# Patient Record
Sex: Male | Born: 1990 | Race: White | Hispanic: No | Marital: Married | Smoking: Never smoker
Health system: Southern US, Community
[De-identification: ages and names within clinical notes are randomized; demographics above are authoritative.]

---
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
## 2013-08-16 ENCOUNTER — Ambulatory Visit: Payer: Self-pay | Admitting: Internal Medicine

## 2013-08-22 ENCOUNTER — Ambulatory Visit (INDEPENDENT_AMBULATORY_CARE_PROVIDER_SITE_OTHER): Payer: PRIVATE HEALTH INSURANCE | Admitting: Internal Medicine

## 2013-08-22 ENCOUNTER — Encounter: Payer: Self-pay | Admitting: Internal Medicine

## 2013-08-22 VITALS — BP 106/64 | HR 72 | Temp 98.0°F | Ht 66.0 in | Wt 156.0 lb

## 2013-08-22 DIAGNOSIS — K625 Hemorrhage of anus and rectum: Secondary | ICD-10-CM

## 2013-08-22 DIAGNOSIS — Z23 Encounter for immunization: Secondary | ICD-10-CM

## 2013-08-22 DIAGNOSIS — K648 Other hemorrhoids: Secondary | ICD-10-CM

## 2013-08-22 LAB — CBC WITH DIFFERENTIAL/PLATELET
BASOS ABS: 0 10*3/uL (ref 0.0–0.1)
BASOS PCT: 0 % (ref 0–1)
EOS PCT: 2 % (ref 0–5)
Eosinophils Absolute: 0.1 10*3/uL (ref 0.0–0.7)
HCT: 47.1 % (ref 39.0–52.0)
HEMOGLOBIN: 16.9 g/dL (ref 13.0–17.0)
Lymphocytes Relative: 25 % (ref 12–46)
Lymphs Abs: 1.8 10*3/uL (ref 0.7–4.0)
MCH: 32 pg (ref 26.0–34.0)
MCHC: 35.9 g/dL (ref 30.0–36.0)
MCV: 89.2 fL (ref 78.0–100.0)
MONO ABS: 0.7 10*3/uL (ref 0.1–1.0)
MONOS PCT: 10 % (ref 3–12)
NEUTROS ABS: 4.4 10*3/uL (ref 1.7–7.7)
Neutrophils Relative %: 63 % (ref 43–77)
PLATELETS: 217 10*3/uL (ref 150–400)
RBC: 5.28 MIL/uL (ref 4.22–5.81)
RDW: 13.3 % (ref 11.5–15.5)
WBC: 7 10*3/uL (ref 4.0–10.5)

## 2013-08-22 LAB — SEDIMENTATION RATE: SED RATE: 1 mm/h (ref 0–16)

## 2013-08-22 MED ORDER — HYDROCORTISONE ACE-PRAMOXINE 1-1 % RE FOAM
1.0000 | Freq: Four times a day (QID) | RECTAL | Status: AC
Start: 2013-08-22 — End: ?

## 2013-08-22 MED ORDER — TETANUS-DIPHTH-ACELL PERTUSSIS 5-2.5-18.5 LF-MCG/0.5 IM SUSP: 0.5000 mL | Freq: Once | INTRAMUSCULAR | Status: AC

## 2013-08-22 NOTE — Patient Instructions (Signed)
Tetanus immunization update given. Use Proctofoam HC qid x 2 weeks. Call if symptoms persist.

## 2014-02-18 ENCOUNTER — Encounter: Payer: Self-pay | Admitting: Internal Medicine

## 2014-02-18 NOTE — Progress Notes (Signed)
   Subjective:    Patient ID: Mario Clark, male    DOB: 11/26/1990, 23 y.o.   MRN: [id number]  HPI  First visit for this 23 year old White male who has had 3 or 4 episodes of rectal bleeding in the past 8-12 days. May have been a bit constipated. No history of serious illnesses.  No known drug allergies  No chronic medications  History of 2 arm fractures, one hand fracture into finger fractures.  Social history: He does not smoke. Social alcohol consumption consisting of beer. He has a high school education and works as a Academic librarianpipe fitter. His mother works for [employer]. He is single.  Family history: Patient's father died of mouth malignancy when patient was 23 years old. Mother is in good health. One sister age 22 in good health. No children.    Review of Systems     Objective:   Physical Exam Rectal exam reveals no masses. No stool to guaiac. Anoscopy shows no active bleeding. Internal hemorrhoids.       Assessment & Plan:  Rectal bleeding secondary to constipation  Plan: Proctofoam HC to use in rectal area 4 times a day for 2 weeks. Consider stool softener if symptoms persist. Needs tetanus immunization update. CBC and sed rate drawn. Tetanus immunization update given.  Addendum: CBC and sedimentation rate are normal.

## 2018-09-29 ENCOUNTER — Encounter (HOSPITAL_COMMUNITY): Payer: Self-pay | Admitting: Emergency Medicine

## 2018-09-29 ENCOUNTER — Ambulatory Visit (HOSPITAL_COMMUNITY)
Admission: EM | Admit: 2018-09-29 | Discharge: 2018-09-29 | Disposition: A | Discharge status: Home / Self Care | Payer: Managed Care, Other (non HMO) | Attending: Family Medicine | Admitting: Family Medicine

## 2018-09-29 DIAGNOSIS — R1084 Generalized abdominal pain: Secondary | ICD-10-CM | POA: Diagnosis not present

## 2018-09-29 DIAGNOSIS — R110 Nausea: Secondary | ICD-10-CM

## 2018-09-29 LAB — GLUCOSE, CAPILLARY: Glucose-Capillary: 76 mg/dL (ref 70–99)

## 2018-09-29 MED ORDER — DICYCLOMINE HCL 20 MG PO TABS: 20.0000 mg | ORAL_TABLET | Freq: Two times a day (BID) | ORAL | 0 refills | Status: AC

## 2018-09-29 MED ORDER — ONDANSETRON 4 MG PO TBDP: 4.0000 mg | ORAL_TABLET | Freq: Three times a day (TID) | ORAL | 0 refills | Status: AC | PRN

## 2018-09-29 MED ORDER — INSULIN ASPART 100 UNIT/ML ~~LOC~~ SOLN
SUBCUTANEOUS | Status: AC
  Filled 2018-09-29: qty 1

## 2018-09-29 MED ORDER — FAMOTIDINE 20 MG PO TABS: 20.0000 mg | ORAL_TABLET | Freq: Every day | ORAL | 0 refills | Status: AC

## 2018-09-29 MED ORDER — ONDANSETRON 4 MG PO TBDP
ORAL_TABLET | ORAL | Status: AC
  Filled 2018-09-29: qty 1

## 2018-09-29 NOTE — Discharge Instructions (Signed)
Your glucose was 76, lower suspicion for diabetes. This should also not be causing your current symptoms. Start pepcid and bently as directed. If still having nausea, you can take zofran to help with symptoms. Keep hydrated, urine should be clear to pale yellow in color. Avoid fatty/spicy foods for now. Follow up with PCP if symptoms still persists after 1 week. If experiencing worsening symptoms, worsening abdominal pain, unwilling to walk/jump due to pain, nausea/vomiting not controlled by medicine, fever, go to the emergency department of further evaluation needed.

## 2018-09-29 NOTE — ED Triage Notes (Signed)
Pt here for nausea and abd pain x 5 days; pt sts pain in intermittent in nature

## 2018-09-29 NOTE — ED Provider Notes (Signed)
[facility]    CSN: [id number] Arrival date & time: 09/29/18  1003     History   Chief Complaint Chief Complaint  Patient presents with  . Nausea  . Abdominal Pain    HPI Mario Clark is a 28 y.o. male.   28 year old male comes in for nausea, abdominal pain. States 4-5 days ago, had 2 episodes of diarrhea with stomach cramping and nausea. This resolved after the end of the day. States for the next 2-3 days, felt better without any symptoms. Last night, he started having nausea, generalized abdominal pain that is extending to today. He has been eating normally since the last episode 4-5 days ago, usually with burgers and take out food. He has not had problems with diary products in the past. Denies eating spicy food. He had potatoes and pork tenderloin last night prior to symptom onset without problems. Denies fever, chills, night sweats. Denies URI symptoms such as cough, congestion, sore throat. Former some day smoker. Current tobacco chewer. Denies drug use. 12 beers/week, denies recent alcohol use. No recent travels. Denies dizziness, weakness, syncope. Family history of DM. Possibly family history of IBS. Denies family history of crohns, UC.      History reviewed. No pertinent past medical history.  There are no active problems to display for this patient.   History reviewed. No pertinent surgical history.     Home Medications    Prior to Admission medications   Medication Sig Start Date End Date Taking? Authorizing Provider  dicyclomine (BENTYL) 20 MG tablet Take 1 tablet (20 mg total) by mouth 2 (two) times daily. 09/29/18   Cathie Hoops, Keavon Sensing V, PA-C  famotidine (PEPCID) 20 MG tablet Take 1 tablet (20 mg total) by mouth daily. 09/29/18   Cathie Hoops, Camron Essman V, PA-C  hydrocortisone-pramoxine (PROCTOFOAM HC) rectal foam Place 1 applicator rectally 4 (four) times daily. 08/22/13   Margaree Mackintosh, MD  ondansetron (ZOFRAN ODT) 4 MG disintegrating tablet Take 1 tablet (4 mg total) by  mouth every 8 (eight) hours as needed for nausea or vomiting. 09/29/18   Belinda Fisher, PA-C    Family History Family History  Problem Relation Age of Onset  . Cancer Father     Social History Social History   Tobacco Use  . Smoking status: Never Smoker  Substance Use Topics  . Alcohol use: Not on file  . Drug use: Not on file     Allergies   Patient has no known allergies.   Review of Systems Review of Systems  Reason unable to perform ROS: See HPI as above.     Physical Exam Triage Vital Signs ED Triage Vitals [09/29/18 1035]  Enc Vitals Group     BP (!) 130/96     Pulse Rate 80     Resp 18     Temp 98.5 F (36.9 C)     Temp Source Oral     SpO2 99 %     Weight      Height      Head Circumference      Peak Flow      Pain Score 5     Pain Loc      Pain Edu?      Excl. in GC?    No data found.  Updated Vital Signs BP (!) 130/96 (BP Location: Right Arm)   Pulse 80   Temp 98.5 F (36.9 C) (Oral)   Resp 18   SpO2 99%  Physical Exam Constitutional:      General: He is not in acute distress.    Appearance: He is well-developed. He is not ill-appearing, toxic-appearing or diaphoretic.  HENT:     Head: Normocephalic and atraumatic.     Mouth/Throat:     Mouth: Mucous membranes are moist.     Pharynx: Oropharynx is clear. Uvula midline.  Cardiovascular:     Rate and Rhythm: Normal rate and regular rhythm.     Heart sounds: Normal heart sounds. No murmur. No friction rub. No gallop.   Pulmonary:     Effort: Pulmonary effort is normal.     Breath sounds: Normal breath sounds. No wheezing or rales.  Abdominal:     General: Abdomen is flat. Bowel sounds are normal.     Palpations: Abdomen is soft. There is no mass.     Tenderness: There is no abdominal tenderness. There is no right CVA tenderness, left CVA tenderness, guarding or rebound.  Skin:    General: Skin is warm and dry.  Neurological:     Mental Status: He is alert and oriented to person,  place, and time.  Psychiatric:        Behavior: Behavior normal.        Judgment: Judgment normal.      UC Treatments / Results  Labs (all labs ordered are listed, but only abnormal results are displayed) Labs Reviewed  GLUCOSE, CAPILLARY  CBG MONITORING, ED    EKG None  Radiology No results found.  Procedures Procedures (including critical care time)  Medications Ordered in UC Medications - No data to display  Initial Impression / Assessment and Plan / UC Course  I have reviewed the triage vital signs and the nursing notes.  Pertinent labs & imaging results that were available during my care of the patient were reviewed by me and considered in my medical decision making (see chart for details).     CBG within normal limits.  Patient's symptoms intermittent, discussed possible GERD causing symptoms as well.  Zofran as needed.  Pepcid as directed.  Bentyl as directed.  Push fluids.  Return precautions given.  Patient expresses understanding and agrees to plan.  Final Clinical Impressions(s) / UC Diagnoses   Final diagnoses:  Nausea without vomiting  Generalized abdominal pain    ED Prescriptions    Medication Sig Dispense Auth. Provider   ondansetron (ZOFRAN ODT) 4 MG disintegrating tablet Take 1 tablet (4 mg total) by mouth every 8 (eight) hours as needed for nausea or vomiting. 15 tablet Tarren Velardi V, PA-C   famotidine (PEPCID) 20 MG tablet Take 1 tablet (20 mg total) by mouth daily. 15 tablet Afnan Cadiente V, PA-C   dicyclomine (BENTYL) 20 MG tablet Take 1 tablet (20 mg total) by mouth 2 (two) times daily. 20 tablet Threasa Alpha, [place] 09/29/18 1317

## 2022-08-24 ENCOUNTER — Emergency Department (HOSPITAL_BASED_OUTPATIENT_CLINIC_OR_DEPARTMENT_OTHER): Payer: Managed Care, Other (non HMO)

## 2022-08-24 ENCOUNTER — Emergency Department (HOSPITAL_BASED_OUTPATIENT_CLINIC_OR_DEPARTMENT_OTHER)
Admission: EM | Admit: 2022-08-24 | Discharge: 2022-08-24 | Disposition: A | Discharge status: Home / Self Care | Payer: Managed Care, Other (non HMO) | Attending: Emergency Medicine | Admitting: Emergency Medicine

## 2022-08-24 ENCOUNTER — Encounter (HOSPITAL_BASED_OUTPATIENT_CLINIC_OR_DEPARTMENT_OTHER): Payer: Self-pay

## 2022-08-24 ENCOUNTER — Other Ambulatory Visit: Payer: Self-pay

## 2022-08-24 DIAGNOSIS — Z1152 Encounter for screening for COVID-19: Secondary | ICD-10-CM | POA: Insufficient documentation

## 2022-08-24 DIAGNOSIS — G43909 Migraine, unspecified, not intractable, without status migrainosus: Secondary | ICD-10-CM

## 2022-08-24 DIAGNOSIS — R519 Headache, unspecified: Secondary | ICD-10-CM | POA: Diagnosis present

## 2022-08-24 LAB — CBC WITH DIFFERENTIAL/PLATELET
Abs Immature Granulocytes: 0.02 10*3/uL (ref 0.00–0.07)
Basophils Absolute: 0 10*3/uL (ref 0.0–0.1)
Basophils Relative: 0 %
Eosinophils Absolute: 0 10*3/uL (ref 0.0–0.5)
Eosinophils Relative: 0 %
HCT: 42.8 % (ref 39.0–52.0)
Hemoglobin: 16 g/dL (ref 13.0–17.0)
Immature Granulocytes: 0 %
Lymphocytes Relative: 12 %
Lymphs Abs: 1 10*3/uL (ref 0.7–4.0)
MCH: 32 pg (ref 26.0–34.0)
MCHC: 37.4 g/dL — ABNORMAL HIGH (ref 30.0–36.0)
MCV: 85.6 fL (ref 80.0–100.0)
Monocytes Absolute: 0.7 10*3/uL (ref 0.1–1.0)
Monocytes Relative: 8 %
Neutro Abs: 6.9 10*3/uL (ref 1.7–7.7)
Neutrophils Relative %: 80 %
Platelets: 298 10*3/uL (ref 150–400)
RBC: 5 MIL/uL (ref 4.22–5.81)
RDW: 12.4 % (ref 11.5–15.5)
WBC: 8.7 10*3/uL (ref 4.0–10.5)
nRBC: 0 % (ref 0.0–0.2)

## 2022-08-24 LAB — BASIC METABOLIC PANEL
Anion gap: 10 (ref 5–15)
BUN: 7 mg/dL (ref 6–20)
CO2: 27 mmol/L (ref 22–32)
Calcium: 9.2 mg/dL (ref 8.9–10.3)
Chloride: 104 mmol/L (ref 98–111)
Creatinine, Ser: 1.04 mg/dL (ref 0.61–1.24)
GFR, Estimated: >60 mL/min (ref >60)
Glucose, Bld: 82 mg/dL (ref 70–99)
Potassium: 3.7 mmol/L (ref 3.5–5.1)
Sodium: 141 mmol/L (ref 135–145)

## 2022-08-24 LAB — RESP PANEL BY RT-PCR (RSV, FLU A&B, COVID)  RVPGX2
Influenza A by PCR: NEGATIVE
Influenza B by PCR: NEGATIVE
Resp Syncytial Virus by PCR: NEGATIVE
SARS Coronavirus 2 by RT PCR: NEGATIVE

## 2022-08-24 LAB — MAGNESIUM: Magnesium: 2.2 mg/dL (ref 1.7–2.4)

## 2022-08-24 MED ORDER — KETOROLAC TROMETHAMINE 30 MG/ML IJ SOLN
30.0000 mg | Freq: Once | INTRAMUSCULAR | Status: AC
  Administered 2022-08-24: 30 mg via INTRAVENOUS
  Filled 2022-08-24: qty 1

## 2022-08-24 MED ORDER — PROCHLORPERAZINE EDISYLATE 10 MG/2ML IJ SOLN
10.0000 mg | Freq: Once | INTRAMUSCULAR | Status: AC
  Administered 2022-08-24: 10 mg via INTRAVENOUS
  Filled 2022-08-24: qty 2

## 2022-08-24 MED ORDER — SUMATRIPTAN SUCCINATE 50 MG PO TABS: 50.0000 mg | ORAL_TABLET | ORAL | 1 refills | Status: AC | PRN

## 2022-08-24 NOTE — ED Provider Notes (Signed)
[facility] Provider Note   CSN: [id number] Arrival date & time: 08/24/22  1409     History  Chief Complaint  Patient presents with   Headache    Mario Clark is a 32 y.o. male.   Headache    Patient presents to the ED for evaluation of acute onset of neurologic symptoms and headache.  Patient was getting his haircut when he suddenly started to feel somewhat nauseous.  He then began having blurred vision in his right eye.  He felt like his peripheral vision was cut out on the right side.  Patient then started experiencing headache following that.  Subsequent to that he started to get some numbness in his hand.  Patient states most the symptoms have resolved at that point although he still has a headache.  He is not currently having any numbness but he does still feel like his vision is a little bit different on the right side.  Patient has been having more issues with recurrent headaches over the last few months.  He has not been seen for it specifically.  Home Medications Prior to Admission medications   Medication Sig Start Date End Date Taking? Authorizing Provider  SUMAtriptan (IMITREX) 50 MG tablet Take 1 tablet (50 mg total) by mouth every 2 (two) hours as needed for migraine. May repeat in 2 hours if headache persists or recurs. 08/24/22  Yes Dorie Rank, MD  dicyclomine (BENTYL) 20 MG tablet Take 1 tablet (20 mg total) by mouth 2 (two) times daily. 09/29/18   Tasia Catchings, Amy V, PA-C  famotidine (PEPCID) 20 MG tablet Take 1 tablet (20 mg total) by mouth daily. 09/29/18   Tasia Catchings, Amy V, PA-C  hydrocortisone-pramoxine (PROCTOFOAM HC) rectal foam Place 1 applicator rectally 4 (four) times daily. 08/22/13   Elby Showers, MD  ondansetron (ZOFRAN ODT) 4 MG disintegrating tablet Take 1 tablet (4 mg total) by mouth every 8 (eight) hours as needed for nausea or vomiting. 09/29/18   Ok Edwards, PA-C      Allergies    Patient has no known allergies.    Review of Systems    Review of Systems  Neurological:  Positive for headaches.    Physical Exam Updated Vital Signs BP 122/75   Pulse 61   Temp 97.9 F (36.6 C) (Oral)   Resp 17   Ht 1.689 m (5' 6.5")   Wt 78 kg   SpO2 96%   BMI 27.34 kg/m  Physical Exam Vitals and nursing note reviewed.  Constitutional:      General: He is not in acute distress.    Appearance: He is well-developed.  HENT:     Head: Normocephalic and atraumatic.     Right Ear: External ear normal.     Left Ear: External ear normal.  Eyes:     General: No visual field deficit or scleral icterus.       Right eye: No discharge.        Left eye: No discharge.     Conjunctiva/sclera: Conjunctivae normal.  Neck:     Trachea: No tracheal deviation.  Cardiovascular:     Rate and Rhythm: Normal rate and regular rhythm.  Pulmonary:     Effort: Pulmonary effort is normal. No respiratory distress.     Breath sounds: Normal breath sounds. No stridor. No wheezing or rales.  Abdominal:     General: Bowel sounds are normal. There is no distension.     Palpations: Abdomen is  soft.     Tenderness: There is no abdominal tenderness. There is no guarding or rebound.  Musculoskeletal:        General: No tenderness.     Cervical back: Neck supple.  Skin:    General: Skin is warm and dry.     Findings: No rash.  Neurological:     Mental Status: He is alert and oriented to person, place, and time.     Cranial Nerves: No cranial nerve deficit, dysarthria or facial asymmetry.     Sensory: No sensory deficit.     Motor: No abnormal muscle tone, seizure activity or pronator drift.     Coordination: Coordination normal.     Comments:  able to hold both legs off bed for 5 seconds, sensation intact in all extremities,  no left or right sided neglect, normal finger-nose exam bilaterally, no nystagmus noted   Psychiatric:        Mood and Affect: Mood normal.     ED Results / Procedures / Treatments   Labs (all labs ordered are listed, but  only abnormal results are displayed) Labs Reviewed  CBC WITH DIFFERENTIAL/PLATELET - Abnormal; Notable for the following components:      Result Value   MCHC 37.4 (*)    All other components within normal limits  RESP PANEL BY RT-PCR (RSV, FLU A&B, COVID)  RVPGX2  BASIC METABOLIC PANEL  MAGNESIUM    EKG EKG Interpretation  Date/Time:  Wednesday August 24 2022 14:26:15 EST Ventricular Rate:  83 PR Interval:  146 QRS Duration: 88 QT Interval:  350 QTC Calculation: 411 R Axis:   78 Text Interpretation: Normal sinus rhythm with sinus arrhythmia Normal ECG No previous ECGs available Confirmed by Dorie Rank [phone]) on 08/24/2022 2:57:59 PM  Radiology CT Head Wo Contrast  Result Date: 08/24/2022 CLINICAL DATA:  Headaches, blurred vision, nausea EXAM: CT HEAD WITHOUT CONTRAST TECHNIQUE: Contiguous axial images were obtained from the base of the skull through the vertex without intravenous contrast. RADIATION DOSE REDUCTION: This exam was performed according to the departmental dose-optimization program which includes automated exposure control, adjustment of the mA and/or kV according to patient size and/or use of iterative reconstruction technique. COMPARISON:  None Available. FINDINGS: Brain: No acute intracranial findings are seen. There are no signs of bleeding within the cranium. Ventricles are not dilated. Cortical sulci are prominent. There is no focal edema or mass effect. Vascular: Unremarkable. Skull: Unremarkable. Sinuses/Orbits: Unremarkable. Other: There is increased amount of CSF in the sella suggesting partially empty sella. IMPRESSION: No acute intracranial findings are seen in noncontrast CT brain. Electronically Signed   By: Elmer Picker M.D.   On: 08/24/2022 14:52    Procedures Procedures    Medications Ordered in ED Medications  prochlorperazine (COMPAZINE) injection 10 mg (10 mg Intravenous Given 08/24/22 1530)  ketorolac (TORADOL) 30 MG/ML injection 30 mg (30 mg  Intravenous Given 08/24/22 1530)    ED Course/ Medical Decision Making/ A&P Clinical Course as of 08/24/22 1717  Wed Aug 24, 2022  1547 Covid flu RSV negative.  CBC and metabolic panel normal. [JK]  1548 Head CT without acute abnormalities [JK]    Clinical Course User Index [JK] Dorie Rank, MD                           Medical Decision Making Frontal diagnosis includes but not limited to cerebral hemorrhage, stroke, TIA, mass, complex migraine  Problems Addressed: Migraine without status  migrainosus, not intractable, unspecified migraine type: acute illness or injury  Risk Prescription drug management.   Patient presented to the ED for evaluation of acute headache.  He had neurologic symptoms.  Fortunately on my exam there is no focal deficits.  Laboratory tests were unremarkable.  CT scan did not show any acute findings.  Patient was given a migraine cocktail and symptoms at this point have resolved.  Discussed findings with the patient.  Cannot completely exclude stroke or TIA with a normal head CT however at this point I suspect this is more of a migraine presentation.  Patient has been having some issues with headaches over the last couple months.  MRI is not available at this facility but at this time I do not feel that he requires transfer for further advanced imaging.  Will refer to neurology.  I will give a prescription for sumatriptan for migraine headaches.        Final Clinical Impression(s) / ED Diagnoses Final diagnoses:  Migraine without status migrainosus, not intractable, unspecified migraine type    Rx / DC Orders ED Discharge Orders          Ordered    SUMAtriptan (IMITREX) 50 MG tablet  Every 2 hours PRN        08/24/22 1713    Ambulatory referral to Neurology       Comments: An appointment is requested in approximately: 2 weeks   08/24/22 1715              Linwood Dibbles, MD 08/24/22 1717

## 2022-08-24 NOTE — Discharge Instructions (Addendum)
Take the medications for recurrent migraine type headaches.  Follow-up with a primary care doctor or neurologist for further evaluation.  I have placed a referral to a neurologist.  Return to the ED for worsening symptoms.

## 2022-08-24 NOTE — ED Triage Notes (Signed)
Patient here POV from Home.  Endorses getting a Haircut today at 1200 when he became suddenly nauseous and his vision began to become blurred and lost peripheral vision to his Right Eye. This was associated with a Headache shortly afterwards.   While registering into ED shortly ago his Right Hand became Numb and Subsided.   NAD Noted during Triage. A&Ox4. GCS 15. Ambulatory.
